# Patient Record
Sex: Female | Born: 1990 | Race: White | Hispanic: No | Marital: Single | Smoking: Never smoker
Health system: Southern US, Community
[De-identification: ages and names within clinical notes are randomized; demographics above are authoritative.]

## PROBLEM LIST (undated history)

## (undated) DIAGNOSIS — R748 Abnormal levels of other serum enzymes: Secondary | ICD-10-CM

## (undated) DIAGNOSIS — F411 Generalized anxiety disorder: Secondary | ICD-10-CM

## (undated) DIAGNOSIS — F32A Depression, unspecified: Secondary | ICD-10-CM

## (undated) DIAGNOSIS — F329 Major depressive disorder, single episode, unspecified: Secondary | ICD-10-CM

## (undated) DIAGNOSIS — F909 Attention-deficit hyperactivity disorder, unspecified type: Secondary | ICD-10-CM

## (undated) HISTORY — PX: ORIF ULNAR FRACTURE: SHX5417

## (undated) HISTORY — DX: Depression, unspecified: F32.A

## (undated) HISTORY — DX: Major depressive disorder, single episode, unspecified: F32.9

## (undated) HISTORY — DX: Abnormal levels of other serum enzymes: R74.8

## (undated) HISTORY — DX: Generalized anxiety disorder: F41.1

---
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
## 2013-05-19 LAB — LIPID PANEL
CHOLESTEROL: 209 mg/dL — AB (ref 0–200)
HDL: 67 mg/dL (ref 35–70)
LDL CALC: 119 mg/dL
TRIGLYCERIDES: 116 mg/dL (ref 40–160)

## 2013-05-19 LAB — BASIC METABOLIC PANEL
BUN: 10 mg/dL (ref 4–21)
CREATININE: 0.8 mg/dL (ref 0.5–1.1)
GLUCOSE: 75 mg/dL
Potassium: 4.3 mmol/L (ref 3.4–5.3)
SODIUM: 134 mmol/L — AB (ref 137–147)

## 2013-05-19 LAB — CBC AND DIFFERENTIAL
HCT: 39 % (ref 36–46)
HEMOGLOBIN: 13.1 g/dL (ref 12.0–16.0)
Neutrophils Absolute: 6630 /uL
PLATELETS: 340 10*3/uL (ref 150–399)
WBC: 10.2 10*3/mL

## 2013-05-19 LAB — HEPATIC FUNCTION PANEL: Bilirubin, Total: 0.4 mg/dL

## 2013-05-19 LAB — VITAMIN D 25 HYDROXY (VIT D DEFICIENCY, FRACTURES): Vit D, 25-Hydroxy: 50

## 2016-01-15 LAB — TSH: TSH: 1.73 u[IU]/mL (ref 0.41–5.90)

## 2016-01-15 LAB — BASIC METABOLIC PANEL
BUN: 13 mg/dL (ref 4–21)
Creatinine: 0.8 mg/dL (ref 0.5–1.1)
Glucose: 74 mg/dL
Potassium: 4.2 mmol/L (ref 3.4–5.3)
Sodium: 139 mmol/L (ref 137–147)

## 2016-01-15 LAB — HEPATIC FUNCTION PANEL
ALT: 242 U/L — AB (ref 7–35)
AST: 222 U/L — AB (ref 13–35)

## 2016-01-27 ENCOUNTER — Ambulatory Visit: Payer: Self-pay | Admitting: Physician Assistant

## 2016-01-29 ENCOUNTER — Ambulatory Visit: Payer: Self-pay | Admitting: Physician Assistant

## 2016-02-18 ENCOUNTER — Ambulatory Visit (INDEPENDENT_AMBULATORY_CARE_PROVIDER_SITE_OTHER): Payer: BLUE CROSS/BLUE SHIELD | Admitting: Physician Assistant

## 2016-02-18 ENCOUNTER — Encounter: Payer: Self-pay | Admitting: Physician Assistant

## 2016-02-18 VITALS — BP 112/75 | HR 114 | Ht 61.0 in | Wt 122.0 lb

## 2016-02-18 DIAGNOSIS — R002 Palpitations: Secondary | ICD-10-CM | POA: Diagnosis not present

## 2016-02-18 DIAGNOSIS — R000 Tachycardia, unspecified: Secondary | ICD-10-CM

## 2016-02-18 DIAGNOSIS — F411 Generalized anxiety disorder: Secondary | ICD-10-CM

## 2016-02-18 DIAGNOSIS — Z8619 Personal history of other infectious and parasitic diseases: Secondary | ICD-10-CM | POA: Insufficient documentation

## 2016-02-18 DIAGNOSIS — F988 Other specified behavioral and emotional disorders with onset usually occurring in childhood and adolescence: Secondary | ICD-10-CM | POA: Insufficient documentation

## 2016-02-18 DIAGNOSIS — F900 Attention-deficit hyperactivity disorder, predominantly inattentive type: Secondary | ICD-10-CM

## 2016-02-18 DIAGNOSIS — R748 Abnormal levels of other serum enzymes: Secondary | ICD-10-CM | POA: Insufficient documentation

## 2016-02-18 DIAGNOSIS — R160 Hepatomegaly, not elsewhere classified: Secondary | ICD-10-CM

## 2016-02-18 HISTORY — DX: Abnormal levels of other serum enzymes: R74.8

## 2016-02-18 HISTORY — DX: Generalized anxiety disorder: F41.1

## 2016-02-18 LAB — CBC WITH DIFFERENTIAL/PLATELET
BASOS PCT: 0 %
Basophils Absolute: 0 cells/uL (ref 0–200)
EOS ABS: 316 {cells}/uL (ref 15–500)
Eosinophils Relative: 4 %
HCT: 40 % (ref 35.0–45.0)
Hemoglobin: 13.3 g/dL (ref 11.7–15.5)
Lymphocytes Relative: 25 %
Lymphs Abs: 1975 cells/uL (ref 850–3900)
MCH: 29.2 pg (ref 27.0–33.0)
MCHC: 33.3 g/dL (ref 32.0–36.0)
MCV: 87.7 fL (ref 80.0–100.0)
MONO ABS: 632 {cells}/uL (ref 200–950)
MONOS PCT: 8 %
MPV: 10.4 fL (ref 7.5–12.5)
Neutro Abs: 4977 cells/uL (ref 1500–7800)
Neutrophils Relative %: 63 %
PLATELETS: 327 10*3/uL (ref 140–400)
RBC: 4.56 MIL/uL (ref 3.80–5.10)
RDW: 13.6 % (ref 11.0–15.0)
WBC: 7.9 10*3/uL (ref 3.8–10.8)

## 2016-02-18 LAB — HEPATIC FUNCTION PANEL
ALT: 13 U/L (ref 6–29)
AST: 13 U/L (ref 10–30)
Albumin: 4.6 g/dL (ref 3.6–5.1)
Alkaline Phosphatase: 31 U/L — ABNORMAL LOW (ref 33–115)
BILIRUBIN DIRECT: 0.1 mg/dL (ref <=0.2)
BILIRUBIN INDIRECT: 0.6 mg/dL (ref 0.2–1.2)
TOTAL PROTEIN: 7 g/dL (ref 6.1–8.1)
Total Bilirubin: 0.7 mg/dL (ref 0.2–1.2)

## 2016-02-18 LAB — IRON AND TIBC
%SAT: 37 % (ref 11–50)
Iron: 134 ug/dL (ref 40–190)
TIBC: 359 ug/dL (ref 250–450)
UIBC: 225 ug/dL (ref 125–400)

## 2016-02-18 LAB — FERRITIN: Ferritin: 24 ng/mL (ref 10–154)

## 2016-02-18 NOTE — Patient Instructions (Signed)
Palpitations A palpitation is the feeling that your heartbeat is irregular or is faster than normal. It may feel like your heart is fluttering or skipping a beat. Palpitations are usually not a serious problem. They may be caused by many things, including smoking, caffeine, alcohol, stress, and certain medicines. Although most causes of palpitations are not serious, palpitations can be a sign of a serious medical problem. In some cases, you may need further medical evaluation. Follow these instructions at home: Pay attention to any changes in your symptoms. Take these actions to help with your condition: Avoid the following: Caffeinated coffee, tea, soft drinks, diet pills, and energy drinks. Chocolate. Alcohol. Do not use any tobacco products, such as cigarettes, chewing tobacco, and e-cigarettes. If you need help quitting, ask your health care provider. Try to reduce your stress and anxiety. Things that can help you relax include: Yoga. Meditation. Physical activity, such as swimming, jogging, or walking. Biofeedback. This is a method that helps you learn to use your mind to control things in your body, such as your heartbeats. Get plenty of rest and sleep. Take over-the-counter and prescription medicines only as told by your health care provider. Keep all follow-up visits as told by your health care provider. This is important. Contact a health care provider if: You continue to have a fast or irregular heartbeat after 24 hours. Your palpitations occur more often. Get help right away if: You have chest pain or shortness of breath. You have a severe headache. You feel dizzy or you faint. This information is not intended to replace advice given to you by your health care provider. Make sure you discuss any questions you have with your health care provider. Document Released: 02/07/2000 Document Revised: 07/15/2015 Document Reviewed: 10/25/2014 Elsevier Interactive Patient Education  2017  [employer] Sinus Tachycardia Sinus tachycardia is a kind of fast heartbeat. In sinus tachycardia, the heart beats more than 100 times a minute. Sinus tachycardia starts in a part of the heart called the sinus node. Sinus tachycardia may be harmless, or it may be a sign of a serious condition. What are the causes? This condition may be caused by:  Exercise or exertion.  A fever.  Pain.  Loss of body fluids (dehydration).  Severe bleeding (hemorrhage).  Anxiety and stress.  Certain substances, including:  Alcohol.  Caffeine.  Tobacco and nicotine products.  Diet pills.  Illegal drugs.  Medical conditions including:  Heart disease.  An infection.  An overactive thyroid (hyperthyroidism).  A lack of red blood cells (anemia). What are the signs or symptoms? Symptoms of this condition include:  A feeling that the heart is beating quickly (palpitations).  Suddenly noticing your heartbeat (cardiac awareness).  Dizziness.  Tiredness (fatigue).  Shortness of breath.  Chest pain.  Nausea.  Fainting. How is this diagnosed? This condition is diagnosed with:  A physical exam.  Other tests, such as:  Blood tests.  An electrocardiogram (ECG). This test measures the electrical activity of the heart.  Holter monitoring. For this test, you wear a device that records your heartbeat for one or more days. You may be referred to a heart specialist (cardiologist). How is this treated? Treatment for this condition depends on the cause or underlying condition. Treatment may involve:  Treating the underlying condition.  Taking new medicines or changing your current medicines as told by your health care provider.  Making changes to your diet or lifestyle.  Practicing relaxation methods. Follow these instructions at home: Lifestyle  Do  not use any products that contain nicotine or tobacco, such as cigarettes and e-cigarettes. If you need help quitting, ask  your health care provider.  Learn relaxation methods, like deep breathing, to help you when you get stressed or anxious.  Do not use illegal drugs, such as cocaine.  Do not abuse alcohol. Limit alcohol intake to no more than 1 drink a day for non-pregnant women and 2 drinks a day for men. One drink equals 12 oz of beer, 5 oz of wine, or 1 oz of hard liquor.  Find time to rest and relax often. This reduces stress.  Avoid:  Caffeine.  Stimulants such as over-the-counter diet pills or pills that help you to stay awake.  Situations that cause anxiety or stress. General instructions  Drink enough fluids to keep your urine clear or pale yellow.  Take over-the-counter and prescription medicines only as told by your health care provider.  Keep all follow-up visits as told by your health care provider. This is important. Contact a health care provider if:  You have a fever.  You have vomiting or diarrhea that keeps happening (is persistent). Get help right away if:  You have pain in your chest, upper arms, jaw, or neck.  You become weak or dizzy.  You feel faint.  You have palpitations that do not go away. This information is not intended to replace advice given to you by your health care provider. Make sure you discuss any questions you have with your health care provider. Document Released: 03/19/2004 Document Revised: 09/07/2015 Document Reviewed: 08/24/2014 Elsevier Interactive Patient Education  2017 [employer]

## 2016-02-18 NOTE — Progress Notes (Signed)
Subjective:    Patient ID: Brittany Glenn, female    DOB: Nov 17, 1990, 25 y.o.   MRN: [id number]  HPI  Pt is a 25 yo female who presents to the clinic to establish care.   .. Active Ambulatory Problems    Diagnosis Date Noted  . GAD (generalized anxiety disorder) 02/18/2016  . ADD (attention deficit disorder) 02/18/2016   Resolved Ambulatory Problems    Diagnosis Date Noted  . No Resolved Ambulatory Problems   No Additional Past Medical History   . Family History  Problem Relation Age of Onset  . Cancer Mother     breast  . Hyperlipidemia Mother   . Hypertension Mother   . BRCA 1/2 Mother   . Hyperlipidemia Father   . Hypertension Father   . Hyperlipidemia Sister   . Hypertension Sister   . BRCA 1/2 Sister   . Diabetes Maternal Aunt   . Diabetes Maternal Uncle   . Stroke Maternal Grandfather   . BRCA 1/2 Sister   . BRCA 1/2 Sister    .Marland Kitchen Social History   Social History  . Marital status: N/A    Spouse name: N/A  . Number of children: N/A  . Years of education: N/A   Occupational History  . Not on file.   Social History Main Topics  . Smoking status: Never Smoker  . Smokeless tobacco: Never Used  . Alcohol use Yes  . Drug use:     Types: Marijuana  . Sexual activity: Yes    Birth control/ protection: IUD   Other Topics Concern  . Not on file   Social History Narrative  . No narrative on file    She has some concerns today. She went to UC on 01/15/16 for dizziness, LUQ pain and tachycardia and some abnormal lab findings were made.   She continues to have tachycardia. On average in morning is 110 but can be as high as 140. If she feels weak and dizzy she just does the valsalva maneuver and usually starts to feel better. She only takes stimulant when she is working 12 hour shifts not daily. She does notice slight increase in heart race with stimulant. She drinks 1-2 caffinated beverages every 3 days.   H.pylori IgG was positive but treatment was  prescribed but not taken since symptoms cleared after 4 tums.  TSH normal.  Kidneys look great.  Liver enzymes: AlK phos 45.  AST 222. ALT 242.  EKG sinus rhythm with no arrthymia, PVC's or ST elevation or depression.   Pt remembers having blood work in 2015 that was normal.  She has 1-2 glasses of wine a week.  Denies any IV drug use.  Has one sexual partner.  NO family hx of liver disease.  No increased tylenol use.  Does smoke occasional marijuana.   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 No work up was done because the patient had no hx of hepatitis or HIV>   She does mention it is becoming uncomfortable to lay on right side for the last month.         Review of Systems    see HPI.  Objective:   Physical Exam  Constitutional: She is oriented to person, place, and time. She appears well-developed and well-nourished.  HENT:  Head: Normocephalic and atraumatic.  Cardiovascular: Normal rate, regular rhythm and normal heart sounds.   Pulmonary/Chest: Effort normal and breath sounds normal.  Abdominal: Soft.  Percussion of liver 9cm liver span and very firm to palpation.   Tenderness in epigastric, RUQ and LUQ area. No rebound or guarding.   Neurological: She is alert and oriented to person, place, and time.  Skin: Skin is dry.  Psychiatric: She has a normal mood and affect. Her behavior is normal.          Assessment & Plan:  Marland KitchenMarland KitchenDavita was seen today for establish care and tachycardia.  Diagnoses and all orders for this visit:  Tachycardia -     CBC with Differential/Platelet -     Ferritin -     Holter monitor - 24 hour; Future  GAD (generalized anxiety disorder)  Attention deficit hyperactivity disorder (ADHD), predominantly inattentive type  Heart palpitations -     CBC with Differential/Platelet -     Holter monitor - 24 hour; Future  Elevated liver enzymes -     Hepatic function panel -     Hepatitis  panel, acute -     Iron Binding Cap (TIBC) -     Ferritin -     IBC panel -     US Abdomen Complete; Future  History of Helicobacter pylori infection -     H. pylori breath test  Hepatomegaly -     US Abdomen Complete; Future   We need to first confirm if she recently had h.pylori that it is cleared with breath test.   Liver enzymes were over 5 times normal. Need to do a hepatic panel, heptatitis panel, iron studies. I did feel like liver was enlarged. Abdominal u/s ordered to evaluate.   Unclear tachycardia. Discussed  Beta blockers since symptomatic. Pt declined. Will get holter monitor. Discussed avoid of stimulants and caffeine.   Follow up in 1 month.

## 2016-02-19 LAB — HEPATITIS PANEL, ACUTE
HCV AB: NEGATIVE
HEP B C IGM: NONREACTIVE
HEP B S AG: NEGATIVE
Hep A IgM: NONREACTIVE

## 2016-02-19 LAB — H. PYLORI BREATH TEST: H. PYLORI BREATH TEST: NOT DETECTED

## 2016-02-19 NOTE — Progress Notes (Signed)
Call pt:  Hepatitis panel negative.  Cbc great.  LIVER enzymes have completely normalized.  Iron stores are a little low. Adding iron to diet or starting a once daily supplement could help.

## 2016-02-19 NOTE — Progress Notes (Signed)
Call pt: h. Pylori not detected.

## 2016-02-26 ENCOUNTER — Ambulatory Visit (INDEPENDENT_AMBULATORY_CARE_PROVIDER_SITE_OTHER): Payer: BLUE CROSS/BLUE SHIELD

## 2016-02-26 DIAGNOSIS — R748 Abnormal levels of other serum enzymes: Secondary | ICD-10-CM

## 2016-02-26 DIAGNOSIS — R160 Hepatomegaly, not elsewhere classified: Secondary | ICD-10-CM

## 2016-03-17 ENCOUNTER — Encounter: Payer: Self-pay | Admitting: Physician Assistant

## 2016-04-02 ENCOUNTER — Encounter: Payer: Self-pay | Admitting: Physician Assistant

## 2016-04-14 ENCOUNTER — Telehealth: Payer: Self-pay | Admitting: Physician Assistant

## 2016-04-14 NOTE — Telephone Encounter (Signed)
Jade,  Our office has called pt few times with no response on trying to get patient scheduled for her monitor appt.  We have removed the order from the work que at this time,  Please see the notes below.  04/14/2016 LMOM for pt to call our office to schedule monitor appt. stpegram 03/20/2016 LMOM for pt to call and schedule monitor appt. stpegram 02/20/16  LMOM TO CALL AND SCHEDULE/D.MILLER  Lamar LaundrySonya

## 2016-04-14 NOTE — Telephone Encounter (Signed)
Thanks for update.  Amber, will you call patient and see if you can reach her with this information?

## 2016-05-05 ENCOUNTER — Encounter: Payer: Self-pay | Admitting: Physician Assistant

## 2016-07-23 ENCOUNTER — Emergency Department (INDEPENDENT_AMBULATORY_CARE_PROVIDER_SITE_OTHER)
Admission: EM | Admit: 2016-07-23 | Discharge: 2016-07-23 | Disposition: A | Discharge status: Home / Self Care | Payer: BLUE CROSS/BLUE SHIELD | Source: Home / Self Care | Attending: Family Medicine | Admitting: Family Medicine

## 2016-07-23 ENCOUNTER — Emergency Department (INDEPENDENT_AMBULATORY_CARE_PROVIDER_SITE_OTHER): Payer: BLUE CROSS/BLUE SHIELD

## 2016-07-23 ENCOUNTER — Encounter: Payer: Self-pay | Admitting: *Deleted

## 2016-07-23 DIAGNOSIS — K5909 Other constipation: Secondary | ICD-10-CM

## 2016-07-23 DIAGNOSIS — K5900 Constipation, unspecified: Secondary | ICD-10-CM

## 2016-07-23 DIAGNOSIS — R1030 Lower abdominal pain, unspecified: Secondary | ICD-10-CM | POA: Diagnosis not present

## 2016-07-23 DIAGNOSIS — D649 Anemia, unspecified: Secondary | ICD-10-CM

## 2016-07-23 HISTORY — DX: Attention-deficit hyperactivity disorder, unspecified type: F90.9

## 2016-07-23 LAB — POCT CBC W AUTO DIFF (K'VILLE URGENT CARE)

## 2016-07-23 LAB — COMPLETE METABOLIC PANEL WITH GFR
ALBUMIN: 4.2 g/dL (ref 3.6–5.1)
ALK PHOS: 35 U/L (ref 33–115)
ALT: 20 U/L (ref 6–29)
AST: 15 U/L (ref 10–30)
BILIRUBIN TOTAL: 0.4 mg/dL (ref 0.2–1.2)
BUN: 16 mg/dL (ref 7–25)
CO2: 27 mmol/L (ref 20–31)
Calcium: 9.5 mg/dL (ref 8.6–10.2)
Chloride: 104 mmol/L (ref 98–110)
Creat: 0.73 mg/dL (ref 0.50–1.10)
GFR, Est African American: >89 mL/min (ref >=60)
GLUCOSE: 73 mg/dL (ref 65–99)
POTASSIUM: 4.5 mmol/L (ref 3.5–5.3)
SODIUM: 138 mmol/L (ref 135–146)
TOTAL PROTEIN: 6.7 g/dL (ref 6.1–8.1)

## 2016-07-23 LAB — TSH: TSH: 1.33 mIU/L

## 2016-07-23 LAB — T4, FREE: Free T4: 1.1 ng/dL (ref 0.8–1.8)

## 2016-07-23 LAB — SEDIMENTATION RATE: SED RATE: 4 mm/h (ref 0–20)

## 2016-07-23 LAB — VITAMIN D 25 HYDROXY (VIT D DEFICIENCY, FRACTURES): VIT D 25 HYDROXY: 31 ng/mL (ref 30–100)

## 2016-07-23 NOTE — ED Triage Notes (Signed)
Pt c/o abd cramping, bloating, decreased appetite and mucus in her stool x 1 wk. Denies fever.

## 2016-07-23 NOTE — ED Provider Notes (Signed)
[facility]    CSN: [id number] Arrival date & time: 07/23/16  0844     History   Chief Complaint Chief Complaint  Patient presents with  . Abdominal Cramping    HPI Brittany Glenn is a 26 y.o. female.   Patient complains of one week history of vague abdominal cramping/bloating and mucous stools.  Her last normal bowel movement was one week ago.  No nausea/vomiting.  No fevers, chills, and sweats.  She reports that there had been no changes in her bowel movements prior to one week ago, but admits that she has generally been constipated (about 2 bowel movements per week) for approximately a year.  She reports mild weight loss of about 5 pounds.  She states that she has been fatigued for about a year, worse recently because of increased professional job stress.  No LMP recorded. Patient is not currently having periods (Reason: IUD).    The history is provided by the patient.    Past Medical History:  Diagnosis Date  . ADHD     Patient Active Problem List   Diagnosis Date Noted  . GAD (generalized anxiety disorder) 02/18/2016  . ADD (attention deficit disorder) 02/18/2016  . Hepatomegaly 02/18/2016  . History of Helicobacter pylori infection 02/18/2016  . Elevated liver enzymes 02/18/2016  . Heart palpitations 02/18/2016    Past Surgical History:  Procedure Laterality Date  . EYE SURGERY  1998  . ORIF ULNAR FRACTURE      OB History    No data available       Home Medications    Prior to Admission medications   Medication Sig Start Date End Date Taking? Authorizing Provider  lisdexamfetamine (VYVANSE) 50 MG capsule Take 50 mg by mouth daily.   Yes [provider]  FLUoxetine (PROZAC) 40 MG capsule Take 40 mg by mouth daily.    [provider]    Family History Family History  Problem Relation Age of Onset  . Cancer Mother        breast  . Hyperlipidemia Mother   . Hypertension Mother   . BRCA 1/2 Mother   . Hyperlipidemia  Father   . Hypertension Father   . Hyperlipidemia Sister   . Hypertension Sister   . BRCA 1/2 Sister   . Diabetes Maternal Aunt   . Diabetes Maternal Uncle   . Stroke Maternal Grandfather   . BRCA 1/2 Sister   . BRCA 1/2 Sister     Social History Social History  Substance Use Topics  . Smoking status: Never Smoker  . Smokeless tobacco: Never Used  . Alcohol use No     Allergies   Patient has no known allergies.   Review of Systems Review of Systems  Constitutional: Positive for activity change, appetite change, fatigue and unexpected weight change. Negative for chills, diaphoresis and fever.  HENT: Negative.   Eyes: Negative.   Respiratory: Negative.   Cardiovascular: Negative.   Gastrointestinal: Positive for abdominal distention, abdominal pain and constipation. Negative for anal bleeding, blood in stool, diarrhea, nausea, rectal pain and vomiting.  Endocrine: Negative.   Genitourinary: Negative.   Musculoskeletal: Negative.   Skin: Negative.   Neurological: Negative for dizziness, light-headedness and headaches.     Physical Exam Triage Vital Signs ED Triage Vitals [07/23/16 0906]  Enc Vitals Group     BP 123/78     Pulse Rate 81     Resp 16     Temp 97.5 F (36.4 C)  Temp Source Oral     SpO2 100 %     Weight 120 lb (54.4 kg)     Height 5' 1"  (1.549 m)     Head Circumference      Peak Flow      Pain Score 0     Pain Loc      Pain Edu?      Excl. in [place]?    No data found.   Updated Vital Signs BP 123/78 (BP Location: Left Arm)   Pulse 81   Temp 97.5 F (36.4 C) (Oral)   Resp 16   Ht 5' 1"  (1.549 m)   Wt 120 lb (54.4 kg)   SpO2 100%   BMI 22.67 kg/m   Visual Acuity Right Eye Distance:   Left Eye Distance:   Bilateral Distance:    Right Eye Near:   Left Eye Near:    Bilateral Near:     Physical Exam Nursing notes and Vital Signs reviewed. Appearance:  Patient appears stated age, and in no acute distress.    Eyes:  Pupils are  equal, round, and reactive to light and accomodation.  Extraocular movement is intact.  Conjunctivae are not inflamed   Pharynx:  Normal; moist mucous membranes  Neck:  Supple.  No adenopathy or thyromegaly. Lungs:  Clear to auscultation.  Breath sounds are equal.  Moving air well. Heart:  Regular rate and rhythm without murmurs, rubs, or gallops.  Abdomen:  Vague mild right abdominal tenderness without masses or hepatosplenomegaly.  Bowel sounds are present.  No CVA or flank tenderness.  Extremities:  No edema.  Skin:  No rash present.     UC Treatments / Results  Labs (all labs ordered are listed, but only abnormal results are displayed) Labs Reviewed  TISSUE TRANSGLUTAMINASE, IGA  COMPLETE METABOLIC PANEL WITH GFR  TSH  T4, FREE  SEDIMENTATION RATE  C-REACTIVE PROTEIN  VITAMIN B12  FOLATE  VITAMIN D 25 HYDROXY (VIT D DEFICIENCY, FRACTURES)  POCT CBC W AUTO DIFF ([facility]):  WBC 6.4; LY 32.9; MO 4.2; GR 62.9; Hgb 11.3; Platelets 312    EKG  EKG Interpretation None       Radiology Dg Abdomen 1 View  Result Date: 07/23/2016 CLINICAL DATA:  Chronic constipation. Lower abdominal pain and bloating for 1 week. EXAM: ABDOMEN - 1 VIEW COMPARISON:  None. FINDINGS: The bowel gas pattern is nonobstructive. Moderate to moderately large stool burden is present throughout the colon. No abnormal abdominal calcification or focal bony abnormality. IUD noted. IMPRESSION: No acute abnormality. Moderate to moderately large colonic stool burden. Electronically Signed   By: Inge Rise M.D.   On: 07/23/2016 09:42    Procedures Procedures (including critical care time)  Medications Ordered in UC Medications - No data to display   Initial Impression / Assessment and Plan / UC Course  I have reviewed the triage vital signs and the nursing notes.  Pertinent labs & imaging results that were available during my care of the patient were reviewed by me and considered in my medical  decision making (see chart for details).    Note newly documented anemia (normal indices).  Hgb previously 13.3 on 02/19/16. Suspect malabsorption. Check the following:  TSH and free T4 Serum B12 and Folate.  Tissue transglutaminase, IgA CRP and Sed rate. CMP  For present constipation, try the following: Begin Miralax:  1/2 to 1 capful mixed in 4 to 8 ounces water daily. Followup with PCP for further evaluation  and management.    Final Clinical Impressions(s) / UC Diagnoses   Final diagnoses:  Other constipation  Lower abdominal pain  Anemia, unspecified type    New Prescriptions Discharge Medication List as of 07/23/2016 10:11 AM       Kandra Nicolas, MD 07/23/16 [phone]

## 2016-07-23 NOTE — Discharge Instructions (Addendum)
Begin Miralax:  1/2 to 1 capful mixed in 4 to 8 ounces water daily.

## 2016-07-24 ENCOUNTER — Ambulatory Visit: Payer: BLUE CROSS/BLUE SHIELD | Admitting: Physician Assistant

## 2016-07-24 ENCOUNTER — Telehealth: Payer: Self-pay | Admitting: Emergency Medicine

## 2016-07-24 DIAGNOSIS — Z0189 Encounter for other specified special examinations: Secondary | ICD-10-CM

## 2016-07-24 LAB — FOLATE: FOLATE: 12.4 ng/mL (ref >5.4)

## 2016-07-24 LAB — TISSUE TRANSGLUTAMINASE, IGA: Tissue Transglutaminase Ab, IgA: 1 U/mL (ref <4)

## 2016-07-24 LAB — C-REACTIVE PROTEIN: CRP: 0.4 mg/L (ref <8.0)

## 2016-07-24 LAB — VITAMIN B12: Vitamin B-12: 262 pg/mL (ref 200–1100)

## 2016-07-28 ENCOUNTER — Ambulatory Visit (INDEPENDENT_AMBULATORY_CARE_PROVIDER_SITE_OTHER): Payer: BLUE CROSS/BLUE SHIELD | Admitting: Physician Assistant

## 2016-07-28 ENCOUNTER — Encounter: Payer: Self-pay | Admitting: Physician Assistant

## 2016-07-28 VITALS — BP 130/83 | HR 101 | Ht 61.0 in | Wt 121.0 lb

## 2016-07-28 DIAGNOSIS — R109 Unspecified abdominal pain: Secondary | ICD-10-CM

## 2016-07-28 DIAGNOSIS — E538 Deficiency of other specified B group vitamins: Secondary | ICD-10-CM | POA: Diagnosis not present

## 2016-07-28 DIAGNOSIS — R195 Other fecal abnormalities: Secondary | ICD-10-CM | POA: Diagnosis not present

## 2016-07-28 DIAGNOSIS — K580 Irritable bowel syndrome with diarrhea: Secondary | ICD-10-CM | POA: Diagnosis not present

## 2016-07-28 MED ORDER — CYANOCOBALAMIN 1000 MCG/ML IJ SOLN
1000.0000 ug | Freq: Once | INTRAMUSCULAR | Status: AC
  Administered 2016-07-28: 1000 ug via INTRAMUSCULAR

## 2016-07-28 MED ORDER — DICYCLOMINE HCL 10 MG PO CAPS: 10.0000 mg | ORAL_CAPSULE | Freq: Three times a day (TID) | ORAL | 0 refills | Status: DC

## 2016-07-28 NOTE — Patient Instructions (Signed)
Diet for Irritable Bowel Syndrome  When you have irritable bowel syndrome (IBS), the foods you eat and your eating habits are very important. IBS may cause various symptoms, such as abdominal pain, constipation, or diarrhea. Choosing the right foods can help ease discomfort caused by these symptoms. Work with your health care provider and dietitian to find the best eating plan to help control your symptoms.  What general guidelines do I need to follow?   Keep a food diary. This will help you identify foods that cause symptoms. Write down:  ? What you eat and when.  ? What symptoms you have.  ? When symptoms occur in relation to your meals.   Avoid foods that cause symptoms. Talk with your dietitian about other ways to get the same nutrients that are in these foods.   Eat more foods that contain fiber. Take a fiber supplement if directed by your dietitian.   Eat your meals slowly, in a relaxed setting.   Aim to eat 5-6 small meals per day. Do not skip meals.   Drink enough fluids to keep your urine clear or pale yellow.   Ask your health care provider if you should take an over-the-counter probiotic during flare-ups to help restore healthy gut bacteria.   If you have cramping or diarrhea, try making your meals low in fat and high in carbohydrates. Examples of carbohydrates are pasta, rice, whole grain breads and cereals, fruits, and vegetables.   If dairy products cause your symptoms to flare up, try eating less of them. You might be able to handle yogurt better than other dairy products because it contains bacteria that help with digestion.  What foods are not recommended?  The following are some foods and drinks that may worsen your symptoms:   Fatty foods, such as French fries.   Milk products, such as cheese or ice cream.   Chocolate.   Alcohol.   Products with caffeine, such as coffee.   Carbonated drinks, such as soda.    The items listed above may not be a complete list of foods and beverages to  avoid. Contact your dietitian for more information.  What foods are good sources of fiber?  Your health care provider or dietitian may recommend that you eat more foods that contain fiber. Fiber can help reduce constipation and other IBS symptoms. Add foods with fiber to your diet a little at a time so that your body can get used to them. Too much fiber at once might cause gas and swelling of your abdomen. The following are some foods that are good sources of fiber:   Apples.   Peaches.   Pears.   Berries.   Figs.   Broccoli (raw).   Cabbage.   Carrots.   Raw peas.   Kidney beans.   Lima beans.   Whole grain bread.   Whole grain cereal.    Where to find more information:  International Foundation for Functional Gastrointestinal Disorders: www.iffgd.org  National Institute of Diabetes and Digestive and Kidney Diseases: www.niddk.nih.gov/health-information/health-topics/digestive-diseases/ibs/Pages/facts.aspx  This information is not intended to replace advice given to you by your health care provider. Make sure you discuss any questions you have with your health care provider.  Document Released: 05/02/2003 Document Revised: 07/18/2015 Document Reviewed: 05/12/2013  Elsevier Interactive Patient Education  2018 Else[employer] Inc[employer]    Irritable Bowel Syndrome, Adult  Irritable bowel syndrome (IBS) is not one specific disease. It is a group of symptoms that affects the organs responsible for   digestion (gastrointestinal or GI tract).  To regulate how your GI tract works, your body sends signals back and forth between your intestines and your brain. If you have IBS, there may be a problem with these signals. As a result, your GI tract does not function normally. Your intestines may become more sensitive and overreact to certain things. This is especially true when you eat certain foods or when you are under stress.  There are four types of IBS. These may be determined based on the consistency of your stool:   IBS  with diarrhea.   IBS with constipation.   Mixed IBS.   Unsubtyped IBS.    It is important to know which type of IBS you have. Some treatments are more likely to be helpful for certain types of IBS.  What are the causes?  The exact cause of IBS is not known.  What increases the risk?  You may have a higher risk of IBS if:   You are a woman.   You are younger than 27 years old.   You have a family history of IBS.   You have mental health problems.   You have had bacterial infection of your GI tract.    What are the signs or symptoms?  Symptoms of IBS vary from person to person. The main symptom is abdominal pain or discomfort. Additional symptoms usually include one or more of the following:   Diarrhea, constipation, or both.   Abdominal swelling or bloating.   Feeling full or sick after eating a small or regular-size meal.   Frequent gas.   Mucus in the stool.   A feeling of having more stool left after a bowel movement.    Symptoms tend to come and go. They may be associated with stress, psychiatric conditions, or nothing at all.  How is this diagnosed?  There is no specific test to diagnose IBS. Your health care provider will make a diagnosis based on a physical exam, medical history, and your symptoms. You may have other tests to rule out other conditions that may be causing your symptoms. These may include:   Blood tests.   X-rays.   CT scan.   Endoscopy and colonoscopy. This is a test in which your GI tract is viewed with a long, thin, flexible tube.    How is this treated?  There is no cure for IBS, but treatment can help relieve symptoms. IBS treatment often includes:   Changes to your diet, such as:  ? Eating more fiber.  ? Avoiding foods that cause symptoms.  ? Drinking more water.  ? Eating regular, medium-sized portioned meals.   Medicines. These may include:  ? Fiber supplements if you have constipation.  ? Medicine to control diarrhea (antidiarrheal medicines).  ? Medicine to help  control muscle spasms in your GI tract (antispasmodic medicines).  ? Medicines to help with any mental health issues, such as antidepressants or tranquilizers.   Therapy.  ? Talk therapy may help with anxiety, depression, or other mental health issues that can make IBS symptoms worse.   Stress reduction.  ? Managing your stress can help keep symptoms under control.    Follow these instructions at home:   Take medicines only as directed by your health care provider.   Eat a healthy diet.  ? Avoid foods and drinks with added sugar.  ? Include more whole grains, fruits, and vegetables gradually into your diet. This may be especially helpful if you have   IBS with constipation.  ? Avoid any foods and drinks that make your symptoms worse. These may include dairy products and caffeinated or carbonated drinks.  ? Do not eat large meals.  ? Drink enough fluid to keep your urine clear or pale yellow.   Exercise regularly. Ask your health care provider for recommendations of good activities for you.   Keep all follow-up visits as directed by your health care provider. This is important.  Contact a health care provider if:   You have constant pain.   You have trouble or pain with swallowing.   You have worsening diarrhea.  Get help right away if:   You have severe and worsening abdominal pain.   You have diarrhea and:  ? You have a rash, stiff neck, or severe headache.  ? You are irritable, sleepy, or difficult to awaken.  ? You are weak, dizzy, or extremely thirsty.   You have bright red blood in your stool or you have black tarry stools.   You have unusual abdominal swelling that is painful.   You vomit continuously.   You vomit blood (hematemesis).   You have both abdominal pain and a fever.  This information is not intended to replace advice given to you by your health care provider. Make sure you discuss any questions you have with your health care provider.  Document Released: 02/09/2005 Document Revised:  07/12/2015 Document Reviewed: 10/27/2013  Elsevier Interactive Patient Education  2018 Elsevier Inc.

## 2016-07-28 NOTE — Progress Notes (Deleted)
   Subjective:    Patient ID: Brittany Glenn, female    DOB: [birth year], 26 y.o.   MRN: [id number]  HPI  Just  Not absorbing b12.  Right lower quadrant pain 3/10.  No acid reflux Constipation always and 2 weeks ago just flipped.  No probiotics.  No new medications and new antibiotics.  Start eating meat again after being vegitatirn.   Needs GI referral.  Review of Systems     Objective:   Physical Exam        Assessment & Plan:

## 2016-07-29 DIAGNOSIS — R109 Unspecified abdominal pain: Secondary | ICD-10-CM | POA: Insufficient documentation

## 2016-07-29 DIAGNOSIS — R195 Other fecal abnormalities: Secondary | ICD-10-CM | POA: Insufficient documentation

## 2016-07-29 NOTE — Progress Notes (Signed)
Subjective:    Patient ID: Brittany Glenn, female    DOB: 30-May-1990, 26 y.o.   MRN: [id number]  HPI Pt is a 26 yo female who presents to the clinic with stool changes and abdominal cramping. She went to UC earlier this month and had some blood work done. She has low normal levels of B12, vitamin D, negative celiac panel. She has off and on right lower quadrant abdominal pain that today she rates a 3/10 but seems to be better after a bowel movement. Denies any acid reflux. She has a hx of constipation and 2 weeks ago her bowel movements changed into loose stools with mucus. Not taking probiotics. Not started any new medications or antibiotics. She did start eating meat again after being a vegetarian. She admits to being under a lot of stress. She is working 60 hours a week. She is looking for another job. She denies any melena or hematochezia.   Marland Kitchen. Active Ambulatory Problems    Diagnosis Date Noted  . GAD (generalized anxiety disorder) 02/18/2016  . ADD (attention deficit disorder) 02/18/2016  . Hepatomegaly 02/18/2016  . History of Helicobacter pylori infection 02/18/2016  . Elevated liver enzymes 02/18/2016  . Heart palpitations 02/18/2016  . Mucus in stool 07/29/2016  . Loose stools 07/29/2016  . Abdominal cramping 07/29/2016   Resolved Ambulatory Problems    Diagnosis Date Noted  . No Resolved Ambulatory Problems   Past Medical History:  Diagnosis Date  . ADHD       Review of Systems  All other systems reviewed and are negative.      Objective:   Physical Exam  Constitutional: She is oriented to person, place, and time. She appears well-developed and well-nourished.  Cardiovascular: Normal rate, regular rhythm and normal heart sounds.   Pulmonary/Chest: Effort normal and breath sounds normal.  Abdominal: Soft. Bowel sounds are normal.  Diffuse tenderness over right lower quadrant to palpation. No guarding or rebound.   Neurological: She is alert and oriented to person,  place, and time.  Skin: Skin is dry.  Psychiatric: She has a normal mood and affect. Her behavior is normal.          Assessment & Plan:  Marland KitchenMarland KitchenDiagnoses and all orders for this visit:  Abdominal cramping -     dicyclomine (BENTYL) 10 MG capsule; Take 1 capsule (10 mg total) by mouth 3 (three) times daily before meals. -     Ambulatory referral to Gastroenterology  B12 deficiency -     cyanocobalamin ((VITAMIN B-12)) injection 1,000 mcg; Inject 1 mL (1,000 mcg total) into the muscle once.  Loose stools -     dicyclomine (BENTYL) 10 MG capsule; Take 1 capsule (10 mg total) by mouth 3 (three) times daily before meals. -     Ambulatory referral to Gastroenterology  Mucus in stool -     dicyclomine (BENTYL) 10 MG capsule; Take 1 capsule (10 mg total) by mouth 3 (three) times daily before meals. -     Ambulatory referral to Gastroenterology  Irritable bowel syndrome with diarrhea -     dicyclomine (BENTYL) 10 MG capsule; Take 1 capsule (10 mg total) by mouth 3 (three) times daily before meals.   I highly suspect IBS. Started bentyl before meals as needed. On SSRI. Gave diet HO. Will refer to GI. Consider probiotic. I suspect the increased stress could have worsened her bowel movements.   b12 low normal. b12 shot given today.start OTC supplementation and consider as needed shots  if help with energy.

## 2016-08-15 ENCOUNTER — Encounter: Payer: Self-pay | Admitting: Physician Assistant

## 2016-08-28 ENCOUNTER — Ambulatory Visit (INDEPENDENT_AMBULATORY_CARE_PROVIDER_SITE_OTHER): Payer: BLUE CROSS/BLUE SHIELD | Admitting: Physician Assistant

## 2016-08-28 ENCOUNTER — Encounter: Payer: Self-pay | Admitting: Physician Assistant

## 2016-08-28 VITALS — BP 125/85 | HR 102 | Ht 61.0 in | Wt 125.0 lb

## 2016-08-28 DIAGNOSIS — R7989 Other specified abnormal findings of blood chemistry: Secondary | ICD-10-CM

## 2016-08-28 DIAGNOSIS — Z975 Presence of (intrauterine) contraceptive device: Secondary | ICD-10-CM | POA: Insufficient documentation

## 2016-08-28 DIAGNOSIS — R238 Other skin changes: Secondary | ICD-10-CM

## 2016-08-28 DIAGNOSIS — F418 Other specified anxiety disorders: Secondary | ICD-10-CM | POA: Diagnosis not present

## 2016-08-28 DIAGNOSIS — E538 Deficiency of other specified B group vitamins: Secondary | ICD-10-CM | POA: Insufficient documentation

## 2016-08-28 MED ORDER — VITAMIN B-12 1000 MCG PO TABS: 2000.0000 ug | ORAL_TABLET | Freq: Every day | ORAL | 11 refills | Status: AC

## 2016-08-28 MED ORDER — VILAZODONE HCL 10 MG PO TABS: 10.0000 mg | ORAL_TABLET | Freq: Every day | ORAL | 1 refills | Status: AC

## 2016-08-28 MED ORDER — CYANOCOBALAMIN 1000 MCG/ML IJ SOLN
1000.0000 ug | INTRAMUSCULAR | Status: AC
  Administered 2016-08-28: 1000 ug via INTRAMUSCULAR

## 2016-08-28 NOTE — Progress Notes (Signed)
HPI:                                                                Brittany Glenn is a 26 y.o. female who presents to [facility] today for depression follow-up  Depression/Anxiety: patient reports she was on Prozac 66m daily for years. She recently trialed a free sample of Viibryd and felt it worked much better for her. She has a psychiatrist, but would like [place] Viibry[place] for [place]  Denies symptoms of mania/hypomania. Denies suicidal thinking. Denies auditory/visual hallucinations.  Patient also reports recent facial breakouts that she has been unable to manage with OTC benzoyl peroxide and her usual skin care regimen. She states she has history of acne that only responded to intralesional corticosteroid injections.  Past Medical History:  Diagnosis Date  . ADHD   . Depression   . Elevated liver enzymes 02/18/2016  . GAD (generalized anxiety disorder) 02/18/2016   Past Surgical History:  Procedure Laterality Date  . EYE SURGERY  1998  . ORIF ULNAR FRACTURE     Social History  Substance Use Topics  . Smoking status: Never Smoker  . Smokeless tobacco: Never Used  . Alcohol use No   family history includes BRCA 1/2 in her mother, sister, sister, and sister; Cancer in her mother; Diabetes in her maternal aunt and maternal uncle; Hyperlipidemia in her father, mother, and sister; Hypertension in her father, mother, and sister; Stroke in her maternal grandfather.  ROS: negative except as noted in the HPI  Medications: Current Outpatient Prescriptions  Medication Sig Dispense Refill  . levonorgestrel (MIRENA) 20 MCG/24HR IUD 1 each by Intrauterine route once.    . lisdexamfetamine (VYVANSE) 50 MG capsule Take 50 mg by mouth daily.    . Vilazodone HCl (VIIBRYD) 10 MG TABS Take 1 tablet (10 mg total) by mouth daily. 90 tablet 1  . vitamin B-12 (CYANOCOBALAMIN)  1000 MCG tablet Take 2 tablets (2,000 mcg total) by mouth daily. 30 tablet 11   Current Facility-Administered Medications  Medication Dose Route Frequency Provider Last Rate Last Dose  . cyanocobalamin ((VITAMIN B-12)) injection 1,000 mcg  1,000 mcg Intramuscular Q30 days CTrixie Dredge PA-C   1,000 mcg at 08/28/16 1131   No Known Allergies     Objective:  BP 125/85   Pulse (!) 102   Ht 5' 1"  (1.549 m)   Wt 125 lb (56.7 kg)   BMI 23.62 kg/m  Gen: well-groomed, cooperative, not ill-appearing, no distress Pulm: Normal work of breathing, normal phonation, clear to auscultation bilaterally CV: Normal rate, regular rhythm, s1 and s2 distinct, no murmurs, clicks or rubs  Neuro: alert and oriented x 3, EOM's intact, no tremor MSK: moving all extremities, normal gait and station Skin: single, <0.5cm papule in the left nasolabial fold Psych: good eye contact, normal affect, euthymic mood, normal speech and thought content    No results found for this or any previous visit (from the past 72 hour(s)). No results found.  Depression screen PHQ 2/9 08/28/2016  Decreased Interest 1  Down, Depressed, Hopeless 0  PHQ - 2 Score 1  Altered sleeping 1  Tired, decreased energy 1  Change in appetite 1  Feeling bad or failure about yourself  0  Trouble concentrating 2  Moving slowly or fidgety/restless 0  Suicidal thoughts 0  PHQ-9 Score 6   GAD 7 : Generalized Anxiety Score 08/28/2016  Nervous, Anxious, on Edge 1  Control/stop worrying 1  Worry too much - different things 1  Trouble relaxing 1  Restless 2  Easily annoyed or irritable 1  Afraid - awful might happen 0  Total GAD 7 Score 7      Assessment and Plan: 26 y.o. female with   1. 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  Dispense: 90 tablet; Refill: 1  2. Low serum vitamin  B12 - B12 on the low end of normal - 1072mg injection today and then daily oral supplementation per PCP - cyanocobalamin ((VITAMIN B-12)) injection 1,000 mcg; Inject 1 mL (1,000 mcg total) into the muscle every 30 (thirty) days. - vitamin B-12 (CYANOCOBALAMIN) 1000 MCG tablet; Take 2 tablets (2,000 mcg total) by mouth daily.  Dispense: 30 tablet; Refill: 11  3. Pimples - patient's skin is mostly clear with the exception of 1 papule and few pimples on her chin. I would not characterize this as even mild acne at this point. Patient declines topical Clinda/Retinoids. She is fairly insistent on proceeding with intralesional injections. Since this is not a cosmetic procedure I am trained to perform, referred her to Dr. TDianah Field Patient education and anticipatory guidance given Patient agrees with treatment plan Follow-up as needed if symptoms worsen or fail to improve  CDarlyne RussianPA-C

## 2016-08-30 ENCOUNTER — Encounter: Payer: Self-pay | Admitting: Physician Assistant

## 2016-09-05 ENCOUNTER — Encounter: Payer: Self-pay | Admitting: Physician Assistant

## 2018-10-09 IMAGING — US US ABDOMEN COMPLETE
1 series · 14 of 25 positions shown · non-contrast
Comparison: None.

CLINICAL DATA: Elevated liver function tests.

EXAM:
ABDOMEN ULTRASOUND COMPLETE

[Series 1: us abdomen complete · 0.12mm/px · 14 of 86 slices shown]
[im 1/86]
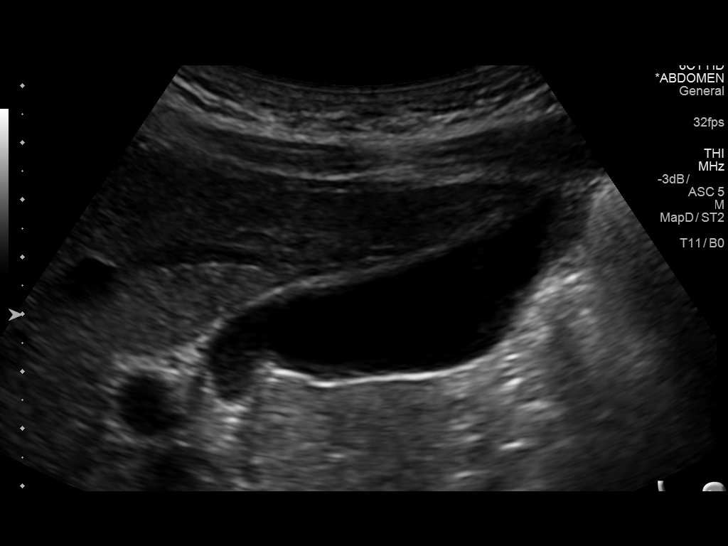
[im 8/86]
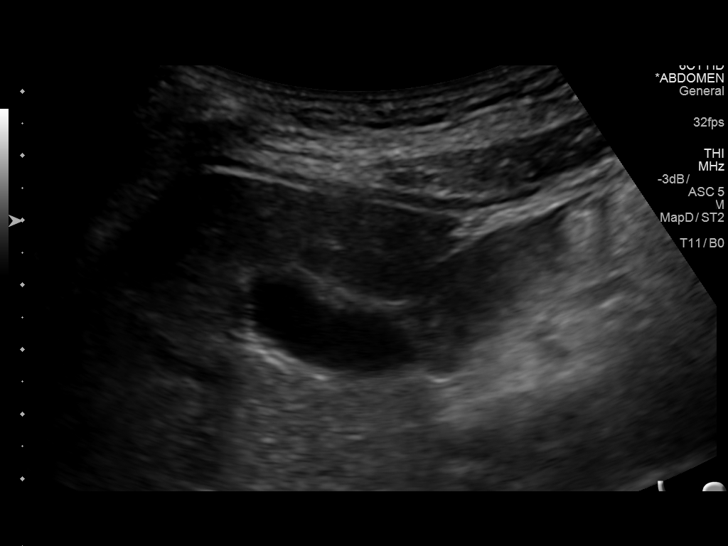
[im 15/86]
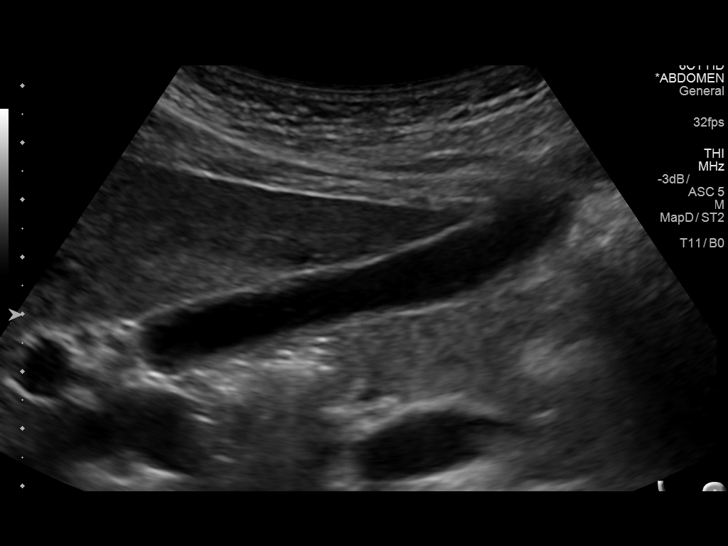
[im 22/86]
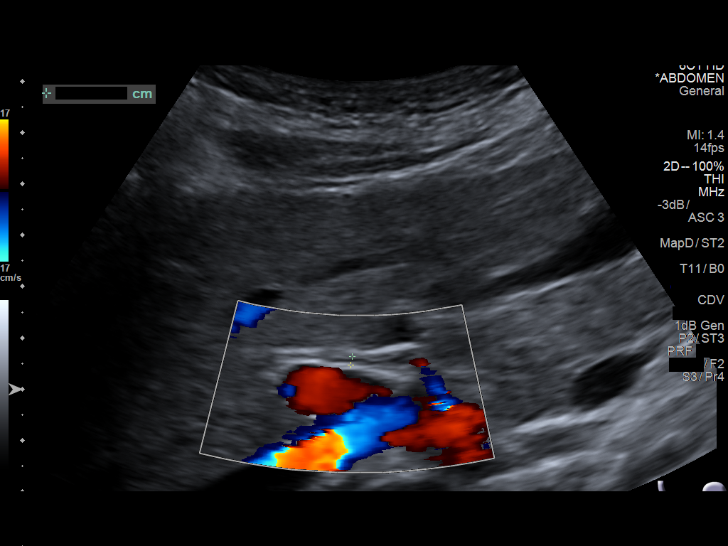
[im 29/86]
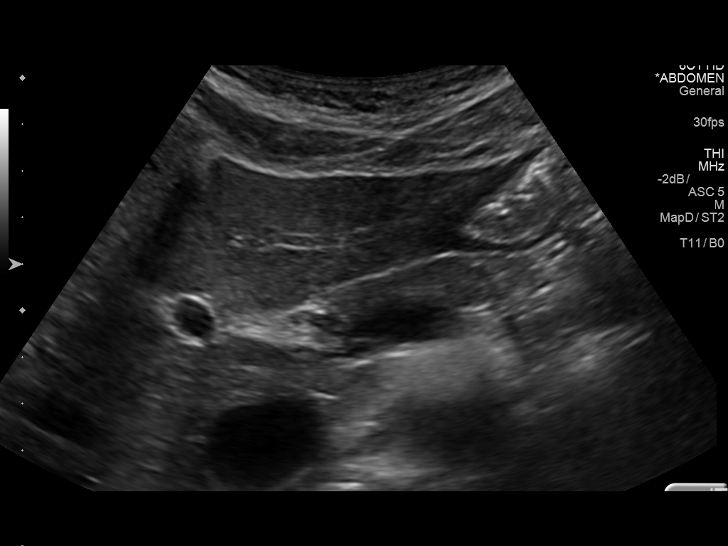
[im 32/86]
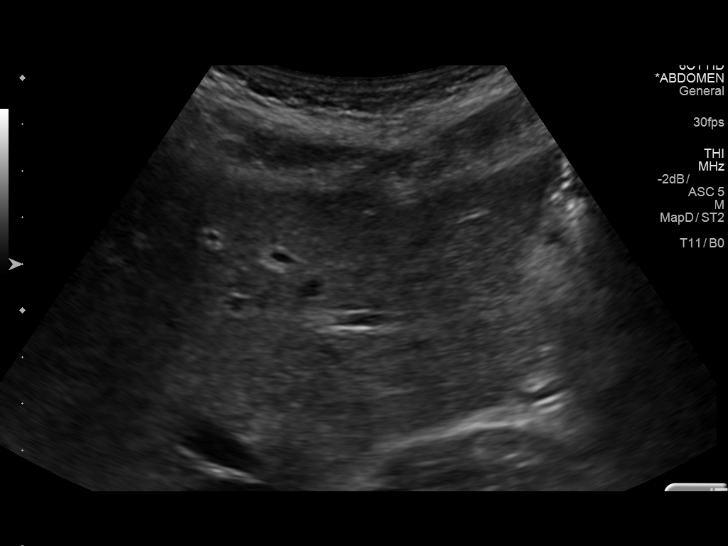
[im 39/86]
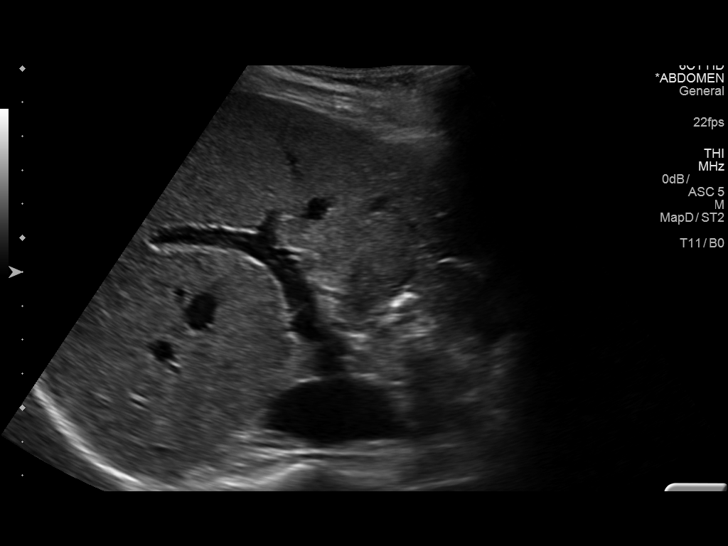
[im 47/86]
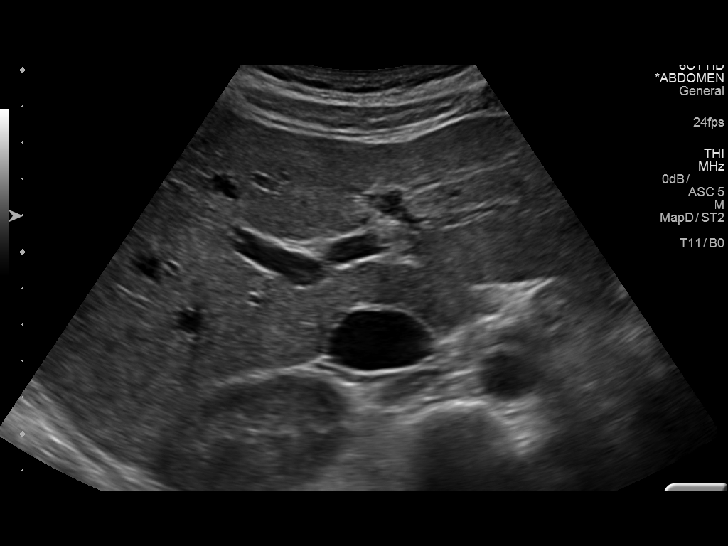
[im 54/86]
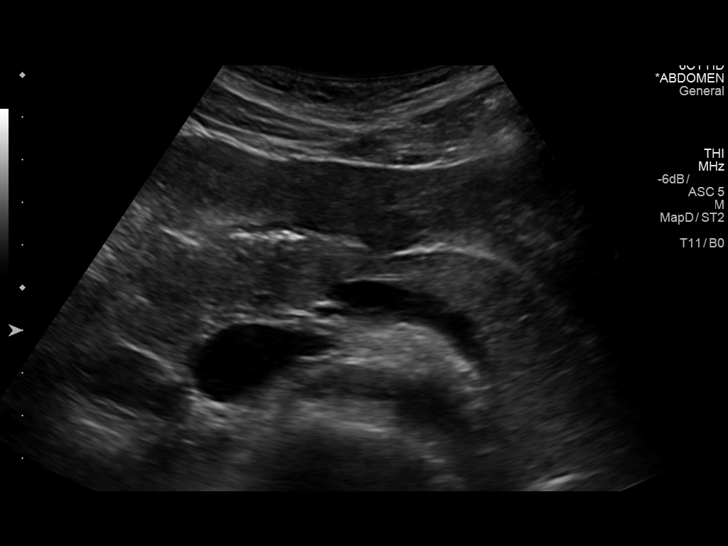
[im 57/86]
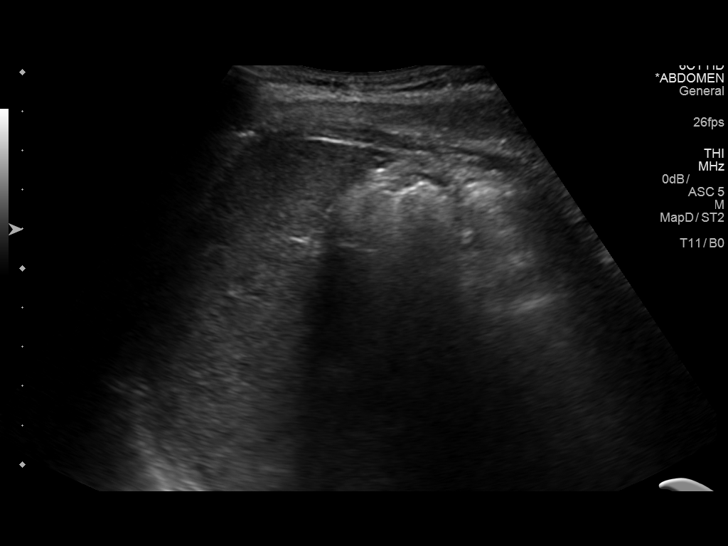
[im 64/86]
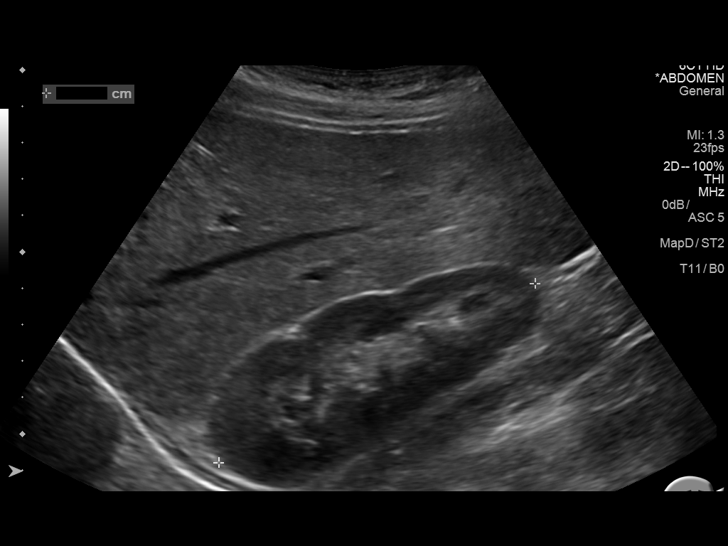
[im 71/86]
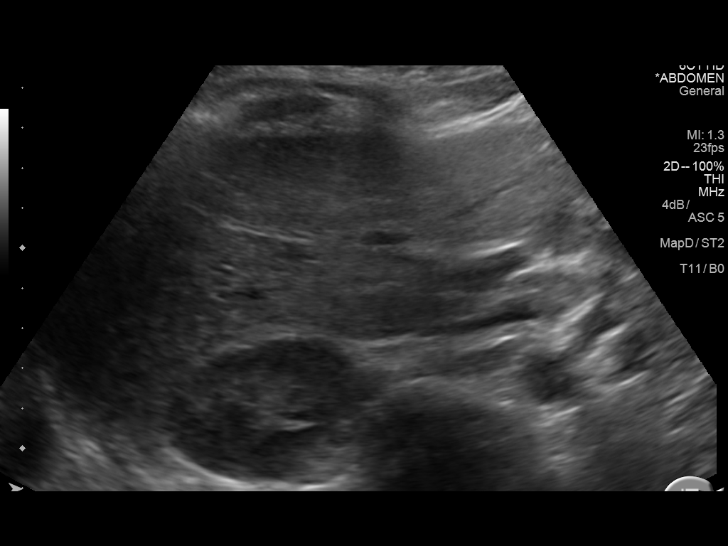
[im 78/86]
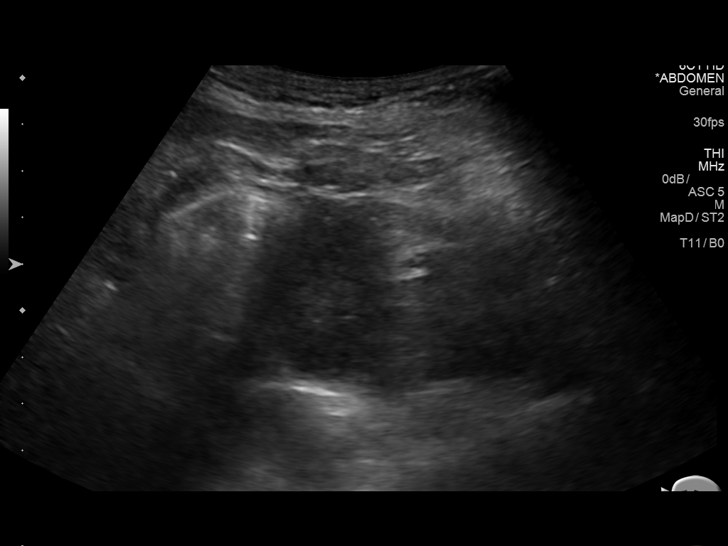
[im 86/86]
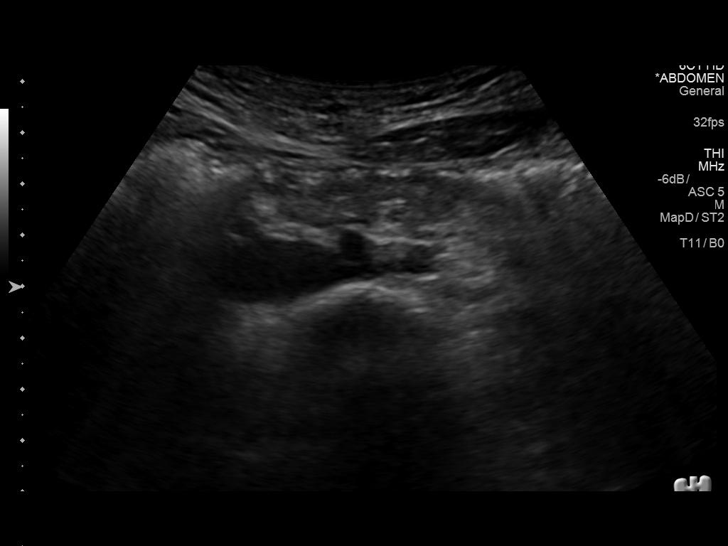

[14 of 25 positions shown; findings below may reference images not displayed]

FINDINGS: Gallbladder: No gallstones or wall thickening visualized. No
sonographic Murphy sign noted by sonographer.

Common bile duct: Diameter: 1.7 mm, normal.

Liver: No focal lesion identified. Within normal limits in
parenchymal echogenicity.

IVC: No abnormality visualized.

Pancreas: Visualized portion unremarkable.

Spleen: 4.9 cm, normal.

Right Kidney: Length: 10.0 cm. Echogenicity within normal limits. No
mass or hydronephrosis visualized.

Left Kidney: Length: 10.5 cm. Echogenicity within normal limits. No
mass or hydronephrosis visualized.

Abdominal aorta: No aneurysm visualized.

Other findings: None.
IMPRESSION: Normal exam.

## 2018-11-15 IMAGING — DX DG ABDOMEN 1V
2 series · 2 of 2 positions shown · non-contrast
Comparison: None.

CLINICAL DATA: Chronic constipation. Lower abdominal pain and
bloating for 1 week.

EXAM:
ABDOMEN - 1 VIEW

[abdomen kub (1 of 2)]
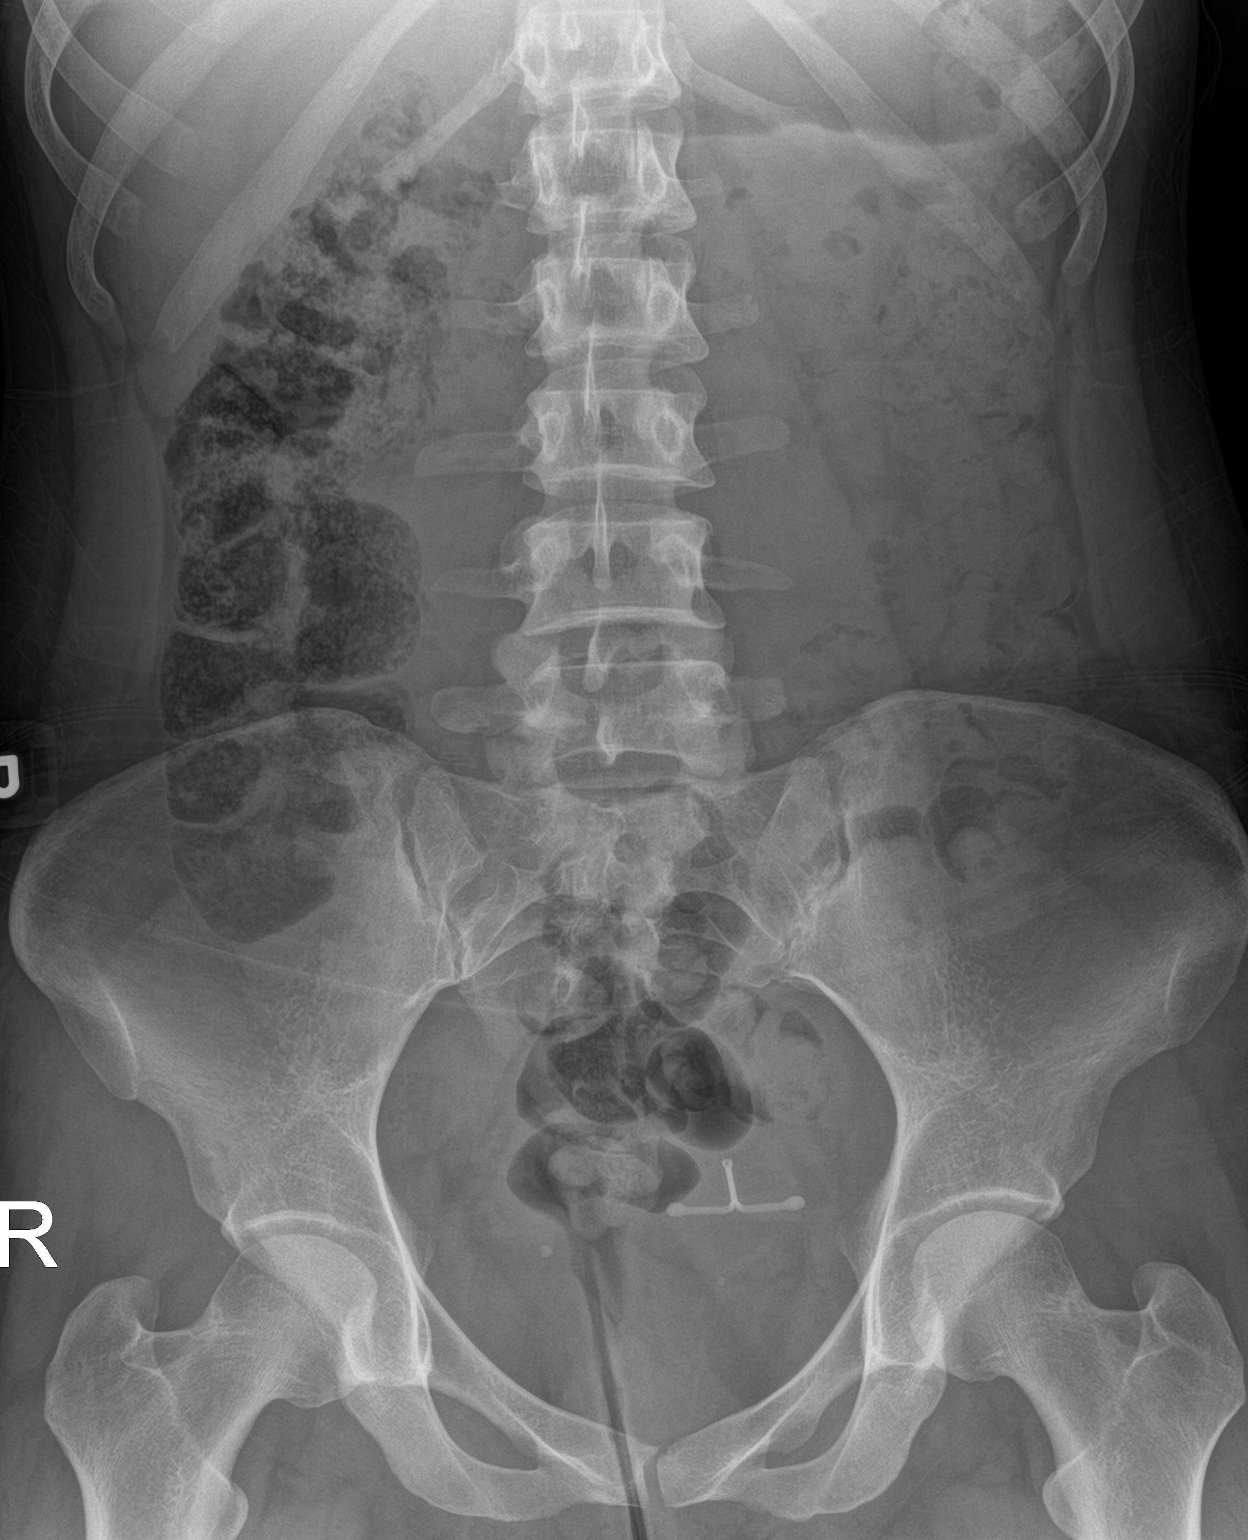

[abdomen kub (2 of 2)]
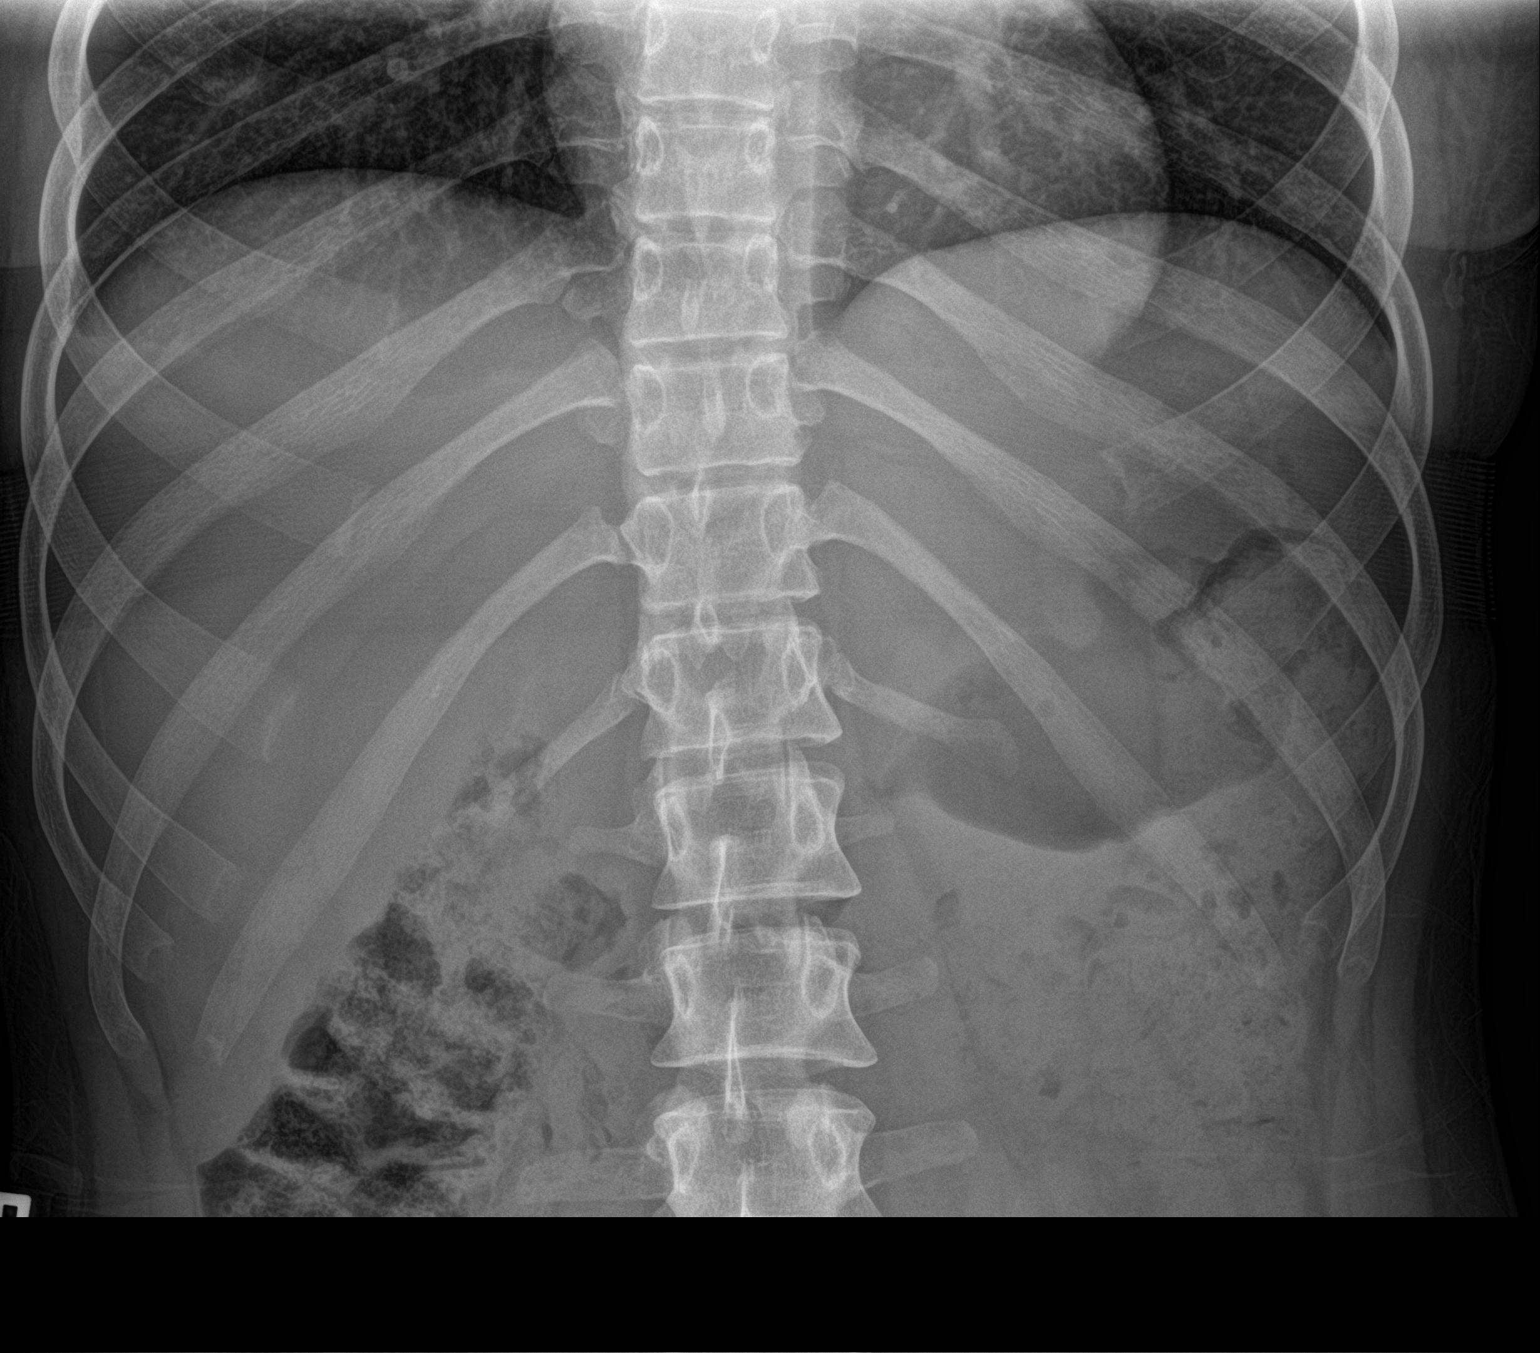

[2 of 2 positions shown; findings below may reference images not displayed]

FINDINGS: The bowel gas pattern is nonobstructive. Moderate to moderately
large stool burden is present throughout the colon. No abnormal
abdominal calcification or focal bony abnormality. IUD noted.
IMPRESSION: No acute abnormality.

Moderate to moderately large colonic stool burden.
